# Patient Record
Sex: Female | Born: 1985 | Race: White | Hispanic: No | Smoking: Current every day smoker
Health system: Southern US, Community
[De-identification: ages and names within clinical notes are randomized; demographics above are authoritative.]

## PROBLEM LIST (undated history)

## (undated) DIAGNOSIS — O24419 Gestational diabetes mellitus in pregnancy, unspecified control: Secondary | ICD-10-CM

## (undated) HISTORY — DX: Gestational diabetes mellitus in pregnancy, unspecified control: O24.419

---

## 2005-09-01 ENCOUNTER — Other Ambulatory Visit: Admission: RE | Admit: 2005-09-01 | Discharge: 2005-09-01 | Payer: Self-pay | Admitting: Obstetrics and Gynecology

## 2005-09-19 ENCOUNTER — Inpatient Hospital Stay (HOSPITAL_COMMUNITY): Admission: AD | Admit: 2005-09-19 | Discharge: 2005-09-20 | Payer: Self-pay | Admitting: Obstetrics and Gynecology

## 2005-12-12 ENCOUNTER — Inpatient Hospital Stay (HOSPITAL_COMMUNITY): Admission: AD | Admit: 2005-12-12 | Discharge: 2005-12-12 | Payer: Self-pay | Admitting: Obstetrics and Gynecology

## 2006-01-10 ENCOUNTER — Inpatient Hospital Stay (HOSPITAL_COMMUNITY): Admission: AD | Admit: 2006-01-10 | Discharge: 2006-01-10 | Payer: Self-pay | Admitting: Obstetrics and Gynecology

## 2006-01-30 ENCOUNTER — Inpatient Hospital Stay (HOSPITAL_COMMUNITY): Admission: AD | Admit: 2006-01-30 | Discharge: 2006-01-30 | Payer: Self-pay | Admitting: Obstetrics and Gynecology

## 2006-02-01 ENCOUNTER — Ambulatory Visit (HOSPITAL_COMMUNITY): Admission: RE | Admit: 2006-02-01 | Discharge: 2006-02-01 | Payer: Self-pay | Admitting: Obstetrics and Gynecology

## 2006-02-16 ENCOUNTER — Inpatient Hospital Stay (HOSPITAL_COMMUNITY): Admission: AD | Admit: 2006-02-16 | Discharge: 2006-02-16 | Payer: Self-pay | Admitting: Obstetrics and Gynecology

## 2006-02-27 ENCOUNTER — Inpatient Hospital Stay (HOSPITAL_COMMUNITY): Admission: AD | Admit: 2006-02-27 | Discharge: 2006-03-02 | Payer: Self-pay | Admitting: Obstetrics and Gynecology

## 2006-04-10 ENCOUNTER — Other Ambulatory Visit: Admission: RE | Admit: 2006-04-10 | Discharge: 2006-04-10 | Payer: Self-pay | Admitting: Obstetrics and Gynecology

## 2010-11-07 ENCOUNTER — Encounter: Payer: Self-pay | Admitting: Family Medicine

## 2014-01-07 ENCOUNTER — Ambulatory Visit: Payer: Self-pay | Admitting: Physician Assistant

## 2014-01-08 ENCOUNTER — Ambulatory Visit: Payer: 59 | Admitting: Physician Assistant

## 2014-01-08 ENCOUNTER — Encounter: Payer: Self-pay | Admitting: Physician Assistant

## 2014-01-08 ENCOUNTER — Ambulatory Visit (INDEPENDENT_AMBULATORY_CARE_PROVIDER_SITE_OTHER): Payer: 59 | Admitting: Physician Assistant

## 2014-01-08 ENCOUNTER — Ambulatory Visit (INDEPENDENT_AMBULATORY_CARE_PROVIDER_SITE_OTHER): Payer: 59

## 2014-01-08 VITALS — BP 112/68 | HR 76 | Ht 64.5 in | Wt 183.0 lb

## 2014-01-08 DIAGNOSIS — F319 Bipolar disorder, unspecified: Secondary | ICD-10-CM

## 2014-01-08 DIAGNOSIS — M722 Plantar fascial fibromatosis: Secondary | ICD-10-CM

## 2014-01-08 DIAGNOSIS — Z131 Encounter for screening for diabetes mellitus: Secondary | ICD-10-CM

## 2014-01-08 DIAGNOSIS — M7730 Calcaneal spur, unspecified foot: Secondary | ICD-10-CM

## 2014-01-08 DIAGNOSIS — M79609 Pain in unspecified limb: Secondary | ICD-10-CM

## 2014-01-08 DIAGNOSIS — R635 Abnormal weight gain: Secondary | ICD-10-CM

## 2014-01-08 DIAGNOSIS — M79671 Pain in right foot: Secondary | ICD-10-CM

## 2014-01-08 DIAGNOSIS — M7731 Calcaneal spur, right foot: Secondary | ICD-10-CM | POA: Insufficient documentation

## 2014-01-08 DIAGNOSIS — Z13220 Encounter for screening for lipoid disorders: Secondary | ICD-10-CM

## 2014-01-08 DIAGNOSIS — M2550 Pain in unspecified joint: Secondary | ICD-10-CM

## 2014-01-08 DIAGNOSIS — M79672 Pain in left foot: Secondary | ICD-10-CM

## 2014-01-08 LAB — COMPLETE METABOLIC PANEL WITH GFR
ALT: 13 U/L (ref 0–35)
AST: 13 U/L (ref 0–37)
Albumin: 4.3 g/dL (ref 3.5–5.2)
Alkaline Phosphatase: 40 U/L (ref 39–117)
BILIRUBIN TOTAL: 0.4 mg/dL (ref 0.2–1.2)
BUN: 7 mg/dL (ref 6–23)
CO2: 27 mEq/L (ref 19–32)
CREATININE: 0.56 mg/dL (ref 0.50–1.10)
Calcium: 9.1 mg/dL (ref 8.4–10.5)
Chloride: 104 mEq/L (ref 96–112)
GLUCOSE: 90 mg/dL (ref 70–99)
Potassium: 4.2 mEq/L (ref 3.5–5.3)
SODIUM: 138 meq/L (ref 135–145)
Total Protein: 6.8 g/dL (ref 6.0–8.3)

## 2014-01-08 LAB — LIPID PANEL
Cholesterol: 120 mg/dL (ref 0–200)
HDL: 35 mg/dL — ABNORMAL LOW (ref >39)
LDL Cholesterol: 69 mg/dL (ref 0–99)
TRIGLYCERIDES: 80 mg/dL (ref <150)
Total CHOL/HDL Ratio: 3.4 Ratio
VLDL: 16 mg/dL (ref 0–40)

## 2014-01-08 LAB — VITAMIN B12: VITAMIN B 12: 394 pg/mL (ref 211–911)

## 2014-01-08 LAB — TSH: TSH: 1.477 u[IU]/mL (ref 0.350–4.500)

## 2014-01-08 LAB — T3, FREE: T3 FREE: 3.3 pg/mL (ref 2.3–4.2)

## 2014-01-08 LAB — RHEUMATOID FACTOR: Rhuematoid fact SerPl-aCnc: <10 IU/mL (ref <=14)

## 2014-01-08 LAB — T4, FREE: Free T4: 1.12 ng/dL (ref 0.80–1.80)

## 2014-01-08 MED ORDER — MELOXICAM 15 MG PO TABS: 15.0000 mg | ORAL_TABLET | Freq: Every day | ORAL | Status: DC

## 2014-01-08 NOTE — Progress Notes (Signed)
Subjective:    Patient ID: Tammy Burch, female    DOB: [birth year], 28 y.o.   MRN: [id number]  HPI  Patient is a 28 year old female who presents to the clinic to establish care.  . Active Ambulatory Problems    Diagnosis Date Noted  . Calcaneal spur of right foot 01/08/2014  . Bipolar disorder, unspecified 01/10/2014   Resolved Ambulatory Problems    Diagnosis Date Noted  . No Resolved Ambulatory Problems   Past Medical History  Diagnosis Date  . Gestational diabetes 2005, 2007   . Family History  Problem Relation Age of Onset  . Depression Mother   . Hypertension Mother   . Depression Father   . Cancer Maternal Grandmother   . Cancer Maternal Grandfather   . Cancer Paternal Grandmother   . Cancer Paternal Grandfather    . History   Social History  . Marital Status: Legally Separated    Spouse Name: N/A    Number of Children: N/A  . Years of Education: N/A   Occupational History  . Not on file.   Social History Main Topics  . Smoking status: Current Every Day Smoker  . Smokeless tobacco: Not on file  . Alcohol Use: Yes  . Drug Use: No  . Sexual Activity: Yes    Birth Control/ Protection: IUD   Other Topics Concern  . Not on file   Social History Narrative  . No narrative on file    Patient's main concern is her right foot pain. She's had pain in bilateral feet for the last year however the past 2 weeks her foot pain has been unbearable. Pain is present in both feet and worse after standing on them all weekend. Patient works as a Leisure centre managerbartender. During the week the pain seems to improve however gets worse over the weekend. This last weekend she felt like she cannot even work. She's tried ibuprofen and help some. She has even started to have some bilateral knee pain.  Patient also like to discuss ongoing depression, anxiety, mood swings. Patient was diagnosed with bipolar and has been on medication to control him the past. She is currently not on any  medication. Previously Zoloft, Celexa, Abilify have not proven any benefit. She denies any suicidal or homicidal thoughts. She definitely feels more down right now. She would like to sleep all the time and has little energy. She does have a history of more manic behavior such as gambling. She has not done that in a while. Patient reports there are some things that she likes to use to self treat her mood. She has a lot going on in her family right now with her mother and sister. She just wants to get away from everybody and get better.   Review of Systems  All other systems reviewed and are negative.       Objective:   Physical Exam  Constitutional: She is oriented to person, place, and time. She appears well-developed and well-nourished.  Overweight  HENT:  Head: Normocephalic and atraumatic.  Cardiovascular: Normal rate, regular rhythm and normal heart sounds.   Pulmonary/Chest: Effort normal and breath sounds normal.  Musculoskeletal:  Bilateral feet-no edema to note. Full ROM without pain. Strength 5/5. Pain over right lateral heel and mid-foot and up into heel. NO pain with palpation over left foot. She describes the pain located over the heel and mid-food when it occurs.   Neurological: She is alert and oriented to person, place, and time.  Skin:  Skin is dry.  Psychiatric: She has a normal mood and affect. Her behavior is normal.          Assessment & Plan:  Right foot pain/left foot pain-will get x-rays today to compare. It does sound like there is some plantar fasciitis as well as potential bone spur and right foot. Discussed treatment. Gave patient walks a can to start daily. Discussed icing with frozen water bottle as well as wearing supportive shoes. Will refer to Dr. Karie Schwalbe. partner for orthotics and further management.  X-rays did confirm spur on calcaneal.  Multiple arthralgias- suspicious of some type of autoimmune disorder. Will get an blood work to evaluate for autoimmune  issues.    Bipolar disorder/fatigue-GAD-7 17, PHQ-9 was19. MDQ-9/13. patient requests we do not refer to psychiatry right now due to her specialist copay. I discussed with her I would try to manage and try a few options but is not succeeding that she needed to go to psychiatry. We'll start with Latuda 20mg  daily. Try samples can send rx if needed.  4 fatigue panel will get B12, TSH, CBC to look for any other causes of fatigue. Follow up in 4-6 weeks.   Obesity/weight management-discuss with patient we did not have time to get into detail with this today. We need to work on helping her pain and getting her moving then we can discuss options of weight loss.

## 2014-01-08 NOTE — Patient Instructions (Addendum)
I would like for you to get personal orthothics.

## 2014-01-09 LAB — ANA: ANA: POSITIVE — AB

## 2014-01-09 LAB — ANTI-NUCLEAR AB-TITER (ANA TITER): ANA Titer 1: NEGATIVE

## 2014-01-09 LAB — VITAMIN D 25 HYDROXY (VIT D DEFICIENCY, FRACTURES): VIT D 25 HYDROXY: 26 ng/mL — AB (ref 30–89)

## 2014-01-09 LAB — CYCLIC CITRUL PEPTIDE ANTIBODY, IGG

## 2014-01-10 DIAGNOSIS — M722 Plantar fascial fibromatosis: Secondary | ICD-10-CM | POA: Insufficient documentation

## 2014-01-10 DIAGNOSIS — F319 Bipolar disorder, unspecified: Secondary | ICD-10-CM | POA: Insufficient documentation

## 2014-01-10 DIAGNOSIS — M2550 Pain in unspecified joint: Secondary | ICD-10-CM | POA: Insufficient documentation

## 2014-01-13 ENCOUNTER — Other Ambulatory Visit: Payer: Self-pay | Admitting: Physician Assistant

## 2014-01-13 ENCOUNTER — Ambulatory Visit (INDEPENDENT_AMBULATORY_CARE_PROVIDER_SITE_OTHER): Payer: 59 | Admitting: Sports Medicine

## 2014-01-13 ENCOUNTER — Encounter: Payer: Self-pay | Admitting: Sports Medicine

## 2014-01-13 VITALS — BP 119/69 | HR 71 | Ht 64.0 in | Wt 175.0 lb

## 2014-01-13 DIAGNOSIS — M722 Plantar fascial fibromatosis: Secondary | ICD-10-CM

## 2014-01-13 DIAGNOSIS — M2550 Pain in unspecified joint: Secondary | ICD-10-CM

## 2014-01-13 DIAGNOSIS — M25569 Pain in unspecified knee: Secondary | ICD-10-CM

## 2014-01-13 DIAGNOSIS — M222X1 Patellofemoral disorders, right knee: Secondary | ICD-10-CM | POA: Insufficient documentation

## 2014-01-13 DIAGNOSIS — R768 Other specified abnormal immunological findings in serum: Secondary | ICD-10-CM

## 2014-01-13 DIAGNOSIS — M25561 Pain in right knee: Secondary | ICD-10-CM

## 2014-01-13 MED ORDER — ALPRAZOLAM 2 MG PO TABS: 2.0000 mg | ORAL_TABLET | ORAL | Status: DC | PRN

## 2014-01-13 NOTE — Assessment & Plan Note (Signed)
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 Custom orthotics and Mobic should help this.  I did advise her that when it does swell she should come in so that we can aspirate fluid.

## 2014-01-13 NOTE — Progress Notes (Signed)
   Subjective:    I'm seeing this patient as a consultation for: Tandy GawJade Breeback, PA-C    CC: Bilateral foot pain  HPI: This is a very pleasant 28 year old female with right worse than left pain in both feet at the calcaneal origin of the plantar fascia, worse with the first few steps in the morning, then particularly bad after long days at work. She also endorses pain she localizes at the metatarsal heads bilaterally. She tends to wear regular but unsupported shoes, and spends 12 hours a day on her feet. Pain is moderate, persistent.  Past medical history, Surgical history, Family history not pertinant except as noted below, Social history, Allergies, and medications have been entered into the medical record, reviewed, and no changes needed.   Review of Systems: No headache, visual changes, nausea, vomiting, diarrhea, constipation, dizziness, abdominal pain, skin rash, fevers, chills, night sweats, weight loss, swollen lymph nodes, body aches, joint swelling, muscle aches, chest pain, shortness of breath, mood changes, visual or auditory hallucinations.   Objective:   General: Well Developed, well nourished, and in no acute distress.  Neuro/Psych: Alert and oriented x3, extra-ocular muscles intact, able to move all 4 extremities, sensation grossly intact. Skin: Warm and dry, no rashes noted.  Respiratory: Not using accessory muscles, speaking in full sentences, trachea midline.  Cardiovascular: Pulses palpable, no extremity edema. Abdomen: Does not appear distended. Bilateral feet: No visible erythema or swelling. Range of motion is full in all directions. Strength is 5/5 in all directions. No hallux valgus. No pes cavus or pes planus. No abnormal callus noted. No pain over the navicular prominence, or base of fifth metatarsal. Tender to palpation of the calcaneal insertion of plantar fascia. No pain at the Achilles insertion. No pain over the calcaneal bursa. No pain of the  retrocalcaneal bursa. No tenderness to palpation over the tarsals, metatarsals, or phalanges. No hallux rigidus or limitus. No tenderness palpation over interphalangeal joints. No pain with compression of the metatarsal heads. Neurovascularly intact distally. There is also tenderness to palpation at the metatarsal heads with only minimal transverse arch breakdown.  Patient was fitted for a : standard, cushioned, semi-rigid orthotic. The orthotic was heated and afterward the patient stood on the orthotic blank positioned on the orthotic stand. The patient was positioned in subtalar neutral position and 10 degrees of ankle dorsiflexion in a weight bearing stance. After completion of molding, a stable base was applied to the orthotic blank. The blank was ground to a stable position for weight bearing. Size: 9 Base: Blue EVA Additional Posting and Padding: None The patient ambulated these, and they were very comfortable.  Impression and Recommendations:   This case required medical decision making of moderate complexity.

## 2014-01-13 NOTE — Assessment & Plan Note (Addendum)
With bilateral metatarsalgia. Custom orthotics as above, Mobic, home rehabilitation given. Advised no barefoot walking at home, return to see me in 4 weeks however if pain continues for an additional week or 2 certainly we could consider an early injection. I will premedicate her with 2 mg of alprazolam 2 hours before procedure.

## 2014-01-23 ENCOUNTER — Telehealth: Payer: Self-pay | Admitting: Physician Assistant

## 2014-01-23 NOTE — Telephone Encounter (Signed)
Patient request a call back about a prescription,she did not state which one on the vm she left'd- Thanks

## 2014-01-24 ENCOUNTER — Other Ambulatory Visit: Payer: Self-pay | Admitting: Physician Assistant

## 2014-01-24 ENCOUNTER — Telehealth: Payer: Self-pay | Admitting: *Deleted

## 2014-01-24 MED ORDER — LURASIDONE HCL 20 MG PO TABS: 1.0000 | ORAL_TABLET | Freq: Every day | ORAL | Status: DC

## 2014-01-24 NOTE — Telephone Encounter (Signed)
Tried calling pt back-she has no vm set up.

## 2014-01-24 NOTE — Telephone Encounter (Signed)
Ok I sent latuda to pharmacy. Make sure to use card given at office visit. If you do not have then come by and pick up. Follow up before run out.

## 2014-01-24 NOTE — Telephone Encounter (Signed)
Pt is requesting an rx for the latuda that you gave her samples of during her last visit.

## 2014-01-28 NOTE — Telephone Encounter (Signed)
Pt called back & was notified of rx.

## 2014-01-29 ENCOUNTER — Telehealth: Payer: Self-pay | Admitting: *Deleted

## 2014-01-29 NOTE — Telephone Encounter (Signed)
Patient calls and states that even with the coupon card you gave her the med is still going to cost her 90.00.  Wants to know if there is something else you can send in, says she is out of the JordanLatuda as of today. Barry DienesKimberly Gordon, LPN

## 2014-01-29 NOTE — Telephone Encounter (Signed)
There is not really a comparative to JordanLatuda. If willing could give samples for a month and buy a month making a little more affordable. If you are having signifcant mood improvement I do think that is a great option. If not I can switch you to lamictal. What would you like to do?

## 2014-01-30 ENCOUNTER — Other Ambulatory Visit: Payer: Self-pay | Admitting: Physician Assistant

## 2014-01-30 MED ORDER — LAMOTRIGINE 25 MG PO TABS: ORAL_TABLET | ORAL | Status: DC

## 2014-01-30 NOTE — Telephone Encounter (Signed)
Pt notified & stated that she has not seen any significant improvement & didn't know if she needed to take it a little longer or needed a higher dose but the $90 is still too steep for her.  She stated that a family member has been taking zoloft for a while & it's worked really well for her.  I advised her that zoloft isn't really a treatment for bipolar, that it was more for anxiety/depression.  Pt is willing to try lamictal.

## 2014-01-30 NOTE — Telephone Encounter (Signed)
Ok will send over lamictal for pt to try. Will taper up. 25mg  qd for 2 weeks then increase to 50mg  for 2 weeks then follow up in office.

## 2014-01-30 NOTE — Telephone Encounter (Signed)
Pt notified & was transferred to scheduling to change her appt with you next week to one month from now.

## 2014-02-05 ENCOUNTER — Ambulatory Visit: Payer: 59 | Admitting: Physician Assistant

## 2014-02-10 ENCOUNTER — Encounter: Payer: Self-pay | Admitting: Sports Medicine

## 2014-02-10 ENCOUNTER — Ambulatory Visit (INDEPENDENT_AMBULATORY_CARE_PROVIDER_SITE_OTHER): Payer: 59 | Admitting: Sports Medicine

## 2014-02-10 VITALS — BP 131/79 | HR 84 | Ht 64.0 in | Wt 185.0 lb

## 2014-02-10 DIAGNOSIS — M25569 Pain in unspecified knee: Secondary | ICD-10-CM

## 2014-02-10 DIAGNOSIS — M722 Plantar fascial fibromatosis: Secondary | ICD-10-CM

## 2014-02-10 DIAGNOSIS — M25561 Pain in right knee: Secondary | ICD-10-CM

## 2014-02-10 NOTE — Progress Notes (Signed)
  Subjective:    CC: Followup  HPI: Right knee pain: Localized under the patella, moderate, persistent, worse going up and down stairs. She did have a positive ANA, reflex titer was negative, with a negative CCP and rheumatoid factor. This is likely completely orthopedic. Has not yet done any rehabilitation exercises. She tells me that x-rays in the past have shown osteoarthritis  Bilateral plantar fasciitis: Continues to improve on a day by day basis with custom orthotics in rehabilitation exercises. She is not yet ready to pursue interventional treatment she has not yet plateaued in terms her pain relief.  Past medical history, Surgical history, Family history not pertinant except as noted below, Social history, Allergies, and medications have been entered into the medical record, reviewed, and no changes needed.   Review of Systems: No fevers, chills, night sweats, weight loss, chest pain, or shortness of breath.   Objective:    General: Well Developed, well nourished, and in no acute distress.  Neuro: Alert and oriented x3, extra-ocular muscles intact, sensation grossly intact.  HEENT: Normocephalic, atraumatic, pupils equal round reactive to light, neck supple, no masses, no lymphadenopathy, thyroid nonpalpable.  Skin: Warm and dry, no rashes. Cardiac: Regular rate and rhythm, no murmurs rubs or gallops, no lower extremity edema.  Respiratory: Clear to auscultation bilaterally. Not using accessory muscles, speaking in full sentences. Right Knee: Normal to inspection with no erythema or effusion or obvious bony abnormalities. Palpation normal with no warmth, joint line tenderness, patellar tenderness, or condyle tenderness. ROM full in flexion and extension and lower leg rotation. Ligaments with solid consistent endpoints including ACL, PCL, LCL, MCL. Negative Mcmurray's, Apley's, and Thessalonian tests. Non painful patellar compression. Patellar glide without crepitus. Patellar and  quadriceps tendons unremarkable. Hamstring and quadriceps strength is normal.   Impression and Recommendations:

## 2014-02-10 NOTE — Assessment & Plan Note (Signed)
With bilateral metatarsalgia, continues to improve with orthotics. She can come back if she plateaus and then we could consider an injection.

## 2014-02-10 NOTE — Assessment & Plan Note (Addendum)
She did have a positive ANA, reflex titer was negative, with a negative CCP and negative rheumatoid factor.  I do not think she needs to follow up with rheumatologist. Pain sound predominantly patellofemoral. Rheumatology is suggesting MRI. I think that she should work on patellofemoral rehabilitation, continue naproxen prescribed by rheumatology, I'd like to see her back in about a month, we will inject her knee if no better. She also tells me that prior x-rays have showed some osteoarthritis

## 2014-02-24 ENCOUNTER — Other Ambulatory Visit: Payer: Self-pay | Admitting: Physician Assistant

## 2014-03-05 ENCOUNTER — Ambulatory Visit: Payer: 59 | Admitting: Physician Assistant

## 2014-03-05 DIAGNOSIS — Z0289 Encounter for other administrative examinations: Secondary | ICD-10-CM

## 2014-03-11 ENCOUNTER — Ambulatory Visit: Payer: 59 | Admitting: Sports Medicine

## 2014-03-17 ENCOUNTER — Other Ambulatory Visit: Payer: Self-pay | Admitting: Physician Assistant

## 2014-03-24 ENCOUNTER — Encounter: Payer: Self-pay | Admitting: Physician Assistant

## 2014-03-24 ENCOUNTER — Ambulatory Visit (INDEPENDENT_AMBULATORY_CARE_PROVIDER_SITE_OTHER): Payer: 59 | Admitting: Sports Medicine

## 2014-03-24 ENCOUNTER — Ambulatory Visit (INDEPENDENT_AMBULATORY_CARE_PROVIDER_SITE_OTHER): Payer: 59 | Admitting: Physician Assistant

## 2014-03-24 ENCOUNTER — Encounter: Payer: Self-pay | Admitting: Sports Medicine

## 2014-03-24 VITALS — BP 117/67 | HR 72 | Wt 185.0 lb

## 2014-03-24 DIAGNOSIS — M25569 Pain in unspecified knee: Secondary | ICD-10-CM

## 2014-03-24 DIAGNOSIS — M222X1 Patellofemoral disorders, right knee: Secondary | ICD-10-CM

## 2014-03-24 DIAGNOSIS — R635 Abnormal weight gain: Secondary | ICD-10-CM

## 2014-03-24 DIAGNOSIS — F319 Bipolar disorder, unspecified: Secondary | ICD-10-CM

## 2014-03-24 MED ORDER — LAMOTRIGINE 100 MG PO TABS: ORAL_TABLET | ORAL | Status: DC

## 2014-03-24 MED ORDER — NAPROXEN-ESOMEPRAZOLE 500-20 MG PO TBEC: 1.0000 | DELAYED_RELEASE_TABLET | Freq: Two times a day (BID) | ORAL | Status: AC

## 2014-03-24 MED ORDER — PHENTERMINE HCL 37.5 MG PO TABS: 37.5000 mg | ORAL_TABLET | Freq: Every day | ORAL | Status: DC

## 2014-03-24 NOTE — Progress Notes (Signed)
    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 Symptoms are moderate, improving.  Past medical history, Surgical history, Family history not pertinant except as noted below, Social history, Allergies, and medications have been entered into the medical record, reviewed, and no changes needed.   Review of Systems: No fevers, chills, night sweats, weight loss, chest pain, or shortness of breath.   Objective:    General: Well Developed, well nourished, and in no acute distress.  Neuro: Alert and oriented x3, extra-ocular muscles intact, sensation grossly intact.  HEENT: Normocephalic, atraumatic, pupils equal round reactive to light, neck supple, no masses, no lymphadenopathy, thyroid nonpalpable.  Skin: Warm and dry, no rashes. Cardiac: Regular rate and rhythm, no murmurs rubs or gallops, no lower extremity edema. Respiratory: Clear to auscultation bilaterally. Not using accessory muscles, speaking in full sentences. Right Knee: Normal to inspection with no erythema or effusion or obvious bony abnormalities. Palpation normal with no warmth, joint line tenderness, patellar tenderness, or condyle tenderness. ROM full in flexion and extension and lower leg rotation. Ligaments with solid consistent endpoints including ACL, PCL, LCL, MCL. Negative Mcmurray's, Apley's, and Thessalonian tests. Non painful patellar compression. Patellar glide without crepitus. Patellar and quadriceps tendons unremarkable. Hamstring and quadriceps strength is normal.    Impression and Recommendations:

## 2014-03-24 NOTE — Assessment & Plan Note (Signed)
She did have some gastritis on simple naproxen, switching to Vimovo. Return in a month, I'm also going add formal physical therapy. If she is no better after one month we will proceed with interventional treatment in the form of a knee injection.

## 2014-03-24 NOTE — Patient Instructions (Addendum)
Melatonin 10mg  at night for sleep.  lamictal increase to 100mg  daily if needed can take 2 tablets daily.

## 2014-03-25 NOTE — Progress Notes (Signed)
   Subjective:    Patient ID: Tammy Burch, female    DOB: 1985/12/16, 28 y.o.   MRN: [id number]  HPI Patient is a 28 year old female who presents to the clinic to follow up on bipolar medication. She was  started  on latuda but she was not able to afford. We then switched her to Lamictal she has transitioned up to 50 mg a day. She reports 50% treatment of mood symptoms. She continues to struggle with sleep at night. She will sleep proximally 4-5 hours at night and then need an during the day. She's not taking anything to help her sleep. She does not feel hopeless or helpless.  Patient would like to discuss something to help her lose weight. She is very motivated. She has made some diet changes but struggling to work and exercise. She has started walking a couple times a week.   Review of Systems  All other systems reviewed and are negative.      Objective:   Physical Exam  Constitutional: She is oriented to person, place, and time. She appears well-developed and well-nourished.  Obese.  HENT:  Head: Normocephalic and atraumatic.  Cardiovascular: Normal rate, regular rhythm and normal heart sounds.   Pulmonary/Chest: Effort normal and breath sounds normal.  Neurological: She is alert and oriented to person, place, and time.  Skin: Skin is dry.  Psychiatric: She has a normal mood and affect. Her behavior is normal.          Assessment & Plan:  Bipolar- Let's increase to 100mg  daily and after 2 weeks can increase to 2tablets daily. Follow up in 4-6 weeks. 50% improvement as a very good number. Our goal is to get to 80-85% improvement with symptoms. A long as we can control symptoms here do not need to send to site.  Abnormal weight gain- will start phentermine 37.5 mg 1 tab daily in the morning. Discussed with patient importance of 1200-calorie diet as well as regular exercise at least 30 minutes 4-5 times a week. Discussed side effects of phentermine. If having increased  insomnia, palpitations, more nervousness or anxiety please stop medication call office. We could consider decreasing the dose before discontinuation. Followup in 4 weeks for weight recheck and symptom recheck. Patient does have some ongoing right knee pain. Weight loss can certainly help this.  Spent 30 minutes with patient greater than 50% of visit spent counseling patient regarding weight loss tips and medications.

## 2014-04-30 ENCOUNTER — Encounter: Payer: 59 | Admitting: Physician Assistant

## 2014-04-30 ENCOUNTER — Ambulatory Visit: Payer: 59 | Admitting: Sports Medicine

## 2014-04-30 ENCOUNTER — Telehealth: Payer: Self-pay | Admitting: Physician Assistant

## 2014-04-30 DIAGNOSIS — Z0289 Encounter for other administrative examinations: Secondary | ICD-10-CM

## 2014-04-30 NOTE — Progress Notes (Signed)
This encounter was created in error - please disregard.

## 2014-04-30 NOTE — Telephone Encounter (Signed)
Call pt: missed appt today. will need to follow up on phentermine before refills. Can given one month of lamictal to give her time to get in office if needed.

## 2014-05-12 ENCOUNTER — Telehealth: Payer: Self-pay

## 2014-05-12 NOTE — Telephone Encounter (Signed)
Stop phentermine and weight loss drinks. Continue on benadryl 25-50mg  up to three times a day. If not throat swelling/lip swelling then ok to wait to come in tomorrow or Wednesday for appt. Once rash goes away only introduce one weight loss item at a time.

## 2014-05-12 NOTE — Telephone Encounter (Signed)
Pt called and stated that last night she started to developed a rash on both her legs, arms, neck, bottom of her feet and her ankles. She stated that is red and itches. Pt denies her throat swelling or the feel of it closing, SOB, nausea, ha or diarrhea. Pt has not changed her lotions, soaps , laundry detergent or tried different foods. She did state that she started on the phentermine about 7 days ago and she also started drinking weight loss drinks at the same time. Pt stated that she has taken 25 mg Benadryl at 3 am and again at noon today. Please advise./Suzzanne Brunkhorst,CMA

## 2014-05-13 NOTE — Telephone Encounter (Signed)
Pt informed with understanding and [facility]./Treyana Sturgell,CMA

## 2014-05-14 ENCOUNTER — Ambulatory Visit: Payer: 59 | Admitting: Physician Assistant

## 2014-05-21 ENCOUNTER — Ambulatory Visit (INDEPENDENT_AMBULATORY_CARE_PROVIDER_SITE_OTHER): Payer: 59 | Admitting: Physician Assistant

## 2014-05-21 ENCOUNTER — Encounter: Payer: Self-pay | Admitting: Physician Assistant

## 2014-05-21 VITALS — BP 119/75 | HR 86 | Ht 64.0 in | Wt 180.0 lb

## 2014-05-21 DIAGNOSIS — Z79899 Other long term (current) drug therapy: Secondary | ICD-10-CM

## 2014-05-21 DIAGNOSIS — E669 Obesity, unspecified: Secondary | ICD-10-CM

## 2014-05-21 DIAGNOSIS — F319 Bipolar disorder, unspecified: Secondary | ICD-10-CM

## 2014-05-21 DIAGNOSIS — N926 Irregular menstruation, unspecified: Secondary | ICD-10-CM

## 2014-05-21 LAB — POCT URINE PREGNANCY: PREG TEST UR: NEGATIVE

## 2014-05-21 MED ORDER — LAMOTRIGINE 200 MG PO TABS: 200.0000 mg | ORAL_TABLET | Freq: Every day | ORAL | Status: DC

## 2014-05-21 NOTE — Progress Notes (Signed)
   Subjective:    Patient ID: Tammy Burch, female    DOB: [birth year], 28 y.o.   MRN: [id number]  HPI Patient is a 28 year old female who presents to the clinic to followup after possible allergic reaction to phentermine. She reports that at 7 days after starting phentermine she developed a rash and 3 days later had some shortness of breath and problems swallowing. She is unaware if this just the phentermine or the over-the-counter [employer] granted weight loss drinks that she was using in combination. She's also concerned because she is 5 days late for her period. She does have the Mirena IUD that has been having regular periods every month with IUD. She did go to emergency room for possible allergic reaction. Steroids were given as well as Benadryl. She was sent home with steroids but she did not take them. Patient is really motivated to lose weight.  Bipolar-patient is on well on increased Lamictal. This which has been her worse we get the she wonders if it's hormonal since she has not started her period.   Review of Systems  All other systems reviewed and are negative.      Objective:   Physical Exam  Constitutional: She is oriented to person, place, and time. She appears well-developed and well-nourished.  HENT:  Head: Normocephalic and atraumatic.  Cardiovascular: Normal rate, regular rhythm and normal heart sounds.   Pulmonary/Chest: Effort normal and breath sounds normal.  Neurological: She is alert and oriented to person, place, and time.  Skin:  NO RASH.  Psychiatric: She has a normal mood and affect. Her behavior is normal.          Assessment & Plan:  Obesity/abnormal weight gain- it is unclear if phentermine or the other supplement she was taking created allergic reactions. Discussed with the patient importance of knowing that second allergic reactions can be worse than the first exposure. Patient is very adamant about trying phentermine again. She does have a course  of prednisone at her home. If she tries again suggested starting at one half tab for at least 7 days then increasing to full tablet. If she has any of the similar symptoms to stop and take prednisone course along with Benadryl. Please let her office know so we can add to the medication list. Discussed other weight loss medications the patient is unable to afford at this time. Encouraged patient with the benefit of exercising regularly and diet changes to help with weight loss.  Missed menses- UPT negative.   Bipolar-refilled Lamictal for 3 months. We'll evaluate at that time if we need to make medication adjustments. In the meantime discuss with patient to call with any worsening symptoms or urgency needs. I am concerned the phentermine be interfering with some of her mental health. If she starts becoming back and has these problems she should discontinue them on our office.

## 2014-05-30 ENCOUNTER — Telehealth: Payer: Self-pay | Admitting: *Deleted

## 2014-05-30 NOTE — Telephone Encounter (Signed)
I spoke with patient at length regarding her problem. She voiced understanding and would try the miralax and if she does not improve will f/u for eval. Corliss SkainsJamie Ellard Nan, CMA

## 2014-05-30 NOTE — Telephone Encounter (Signed)
Tammy Burch calls to office with concerns that she may also have mono since her daughter was recently diagnosed. She said that she is feeling "sluggish" and has discomfort on her left side abd area. She denies fever or sorethroat, headache. I asked her when the last time she moved her bowels and if her stomach was distended or hard. She said that it had been several days since she has had a bowel movement. She didn't feel she needed an emergent appt but I told her I would certainly talk with you and call her back. Please advise. Corliss SkainsJamie Kenly Xiao, CMA

## 2014-05-30 NOTE — Telephone Encounter (Signed)
With exposure you could certainly have mono. Mono is a virus and treated symptomatically with ibuprofen, rest, and hydration.  If not having bowel movements could be causing some abdominal pain. Suggest increasing water and taking 1 capful of miralax twice daily for next 3 days.  If pain is worsening or has n/v/d or spike fever. Can come to be evaluated in UC.

## 2014-06-04 ENCOUNTER — Ambulatory Visit (INDEPENDENT_AMBULATORY_CARE_PROVIDER_SITE_OTHER): Payer: 59 | Admitting: Physician Assistant

## 2014-06-04 ENCOUNTER — Ambulatory Visit (INDEPENDENT_AMBULATORY_CARE_PROVIDER_SITE_OTHER): Payer: 59

## 2014-06-04 ENCOUNTER — Encounter: Payer: Self-pay | Admitting: Physician Assistant

## 2014-06-04 VITALS — BP 109/72 | HR 83 | Wt 175.0 lb

## 2014-06-04 DIAGNOSIS — R350 Frequency of micturition: Secondary | ICD-10-CM

## 2014-06-04 DIAGNOSIS — Z975 Presence of (intrauterine) contraceptive device: Secondary | ICD-10-CM

## 2014-06-04 DIAGNOSIS — R221 Localized swelling, mass and lump, neck: Secondary | ICD-10-CM

## 2014-06-04 DIAGNOSIS — E049 Nontoxic goiter, unspecified: Secondary | ICD-10-CM

## 2014-06-04 DIAGNOSIS — R102 Pelvic and perineal pain: Secondary | ICD-10-CM

## 2014-06-04 DIAGNOSIS — R220 Localized swelling, mass and lump, head: Secondary | ICD-10-CM

## 2014-06-04 DIAGNOSIS — N949 Unspecified condition associated with female genital organs and menstrual cycle: Secondary | ICD-10-CM

## 2014-06-04 DIAGNOSIS — R1031 Right lower quadrant pain: Secondary | ICD-10-CM

## 2014-06-04 LAB — POCT URINALYSIS DIPSTICK
BILIRUBIN UA: NEGATIVE
Blood, UA: NEGATIVE
Glucose, UA: NEGATIVE
KETONES UA: NEGATIVE
Leukocytes, UA: NEGATIVE
Nitrite, UA: NEGATIVE
PH UA: 8
Protein, UA: NEGATIVE
Spec Grav, UA: 1.02
Urobilinogen, UA: 0.2

## 2014-06-04 MED ORDER — SULFAMETHOXAZOLE-TRIMETHOPRIM 800-160 MG PO TABS: 1.0000 | ORAL_TABLET | Freq: Two times a day (BID) | ORAL | Status: DC

## 2014-06-04 NOTE — Progress Notes (Signed)
   Subjective:    Patient ID: Tammy Burch, female    DOB: [birth year], 28 y.o.   MRN: [id number]  HPI Pt present to the clinic with urinary urgency, pelivic pain, RLQ pain. Urinating around every 10-15 seconds. Pain has been pretty consistent for last 3 days. No fever, chills, n/v/d. No vaginal discharge. No flank or back pain. Rates pain 6/10 at times.    Review of Systems  All other systems reviewed and are negative.      Objective:   Physical Exam  Constitutional: She is oriented to person, place, and time. She appears well-developed and well-nourished.  HENT:  Head: Normocephalic and atraumatic.  Neck: Normal range of motion. Neck supple.  Neck fullness bilaterally.   Cardiovascular: Normal rate, regular rhythm and normal heart sounds.   Pulmonary/Chest: Effort normal and breath sounds normal.  No CVA tenderness.   Abdominal: Soft. Bowel sounds are normal.  No guarding or rebound.  RLQ tenderness to palpation.   Neurological: She is alert and oriented to person, place, and time.  Skin: Skin is dry.  Psychiatric: She has a normal mood and affect. Her behavior is normal.          Assessment & Plan:  RLQ pain/pelvic pain/urinary frequency- .. Results for orders placed in visit on 06/04/14  URINE CULTURE      Result Value Ref Range   Colony Count 2,000 COLONIES/ML     Organism ID, Bacteria Insignificant Growth    POCT URINALYSIS DIPSTICK      Result Value Ref Range   Color, UA yellow     Clarity, UA clear     Glucose, UA neg     Bilirubin, UA neg     Ketones, UA neg     Spec Grav, UA 1.020     Blood, UA neg     pH, UA 8.0     Protein, UA neg     Urobilinogen, UA 0.2     Nitrite, UA neg     Leukocytes, UA Negative     Started on bactrim for 10 days.   Pt was called with results.  Cultured with no growth. Encouraged to stop bactrim since no infection. Pelvic ultrasound did show some free fluid.  Discussed with pt likely had ruputred ovarian cyst that was  causing the pain and symptoms. Follow up if not improving.   Neck fullness- will get ultraound to evaluate.

## 2014-06-05 ENCOUNTER — Telehealth: Payer: Self-pay

## 2014-06-05 NOTE — Telephone Encounter (Signed)
Tammy Burch reports she is now having vaginal pain and a worsening of pain in her back and pelvic area. She did take Naproxen with some relief last night. She was seen yesterday by Lesly RubensteinJade and had an ultra sound that was normal for the most part. Denies nausea, vomiting, fever, chills or sweats. Urine culture is still pending. Please advise.

## 2014-06-05 NOTE — Telephone Encounter (Signed)
recommend UC visit this evening.  Needs to be evaluated for PID.

## 2014-06-05 NOTE — Telephone Encounter (Signed)
Patient called and left a message on nurse line asking for a return call.   Returned Call: Left message asking patient to call back.  

## 2014-06-05 NOTE — Telephone Encounter (Signed)
Patient advised of recommendations.  

## 2014-06-06 ENCOUNTER — Ambulatory Visit: Payer: 59 | Admitting: Physician Assistant

## 2014-06-06 LAB — URINE CULTURE: Colony Count: 2000

## 2014-06-30 ENCOUNTER — Other Ambulatory Visit: Payer: Self-pay | Admitting: Physician Assistant

## 2014-07-03 ENCOUNTER — Other Ambulatory Visit: Payer: Self-pay | Admitting: Physician Assistant

## 2014-07-04 ENCOUNTER — Ambulatory Visit: Payer: 59 | Admitting: Family Medicine

## 2014-07-11 ENCOUNTER — Ambulatory Visit (INDEPENDENT_AMBULATORY_CARE_PROVIDER_SITE_OTHER): Payer: 59 | Admitting: Physician Assistant

## 2014-07-11 ENCOUNTER — Encounter: Payer: Self-pay | Admitting: Physician Assistant

## 2014-07-11 VITALS — BP 117/76 | HR 80 | Ht 64.0 in | Wt 171.0 lb

## 2014-07-11 DIAGNOSIS — R635 Abnormal weight gain: Secondary | ICD-10-CM

## 2014-07-11 MED ORDER — PHENTERMINE HCL 37.5 MG PO TABS: 37.5000 mg | ORAL_TABLET | Freq: Every day | ORAL | Status: DC

## 2014-07-14 NOTE — Progress Notes (Signed)
   Subjective:    Patient ID: Tammy Burch, female    DOB: [birth year], 28 y.o.   MRN: [id number]  HPI Pt presents to the clinic for follow up on phentermine. She is doing well and lost 9lbs in one month. She has stopped drinking sodas. She drinks only water. Not exercising regularly. Denies any side effects to medication. She is only taking one/half dose for this month.    Review of Systems  All other systems reviewed and are negative.      Objective:   Physical Exam  Constitutional: She is oriented to person, place, and time. She appears well-developed and well-nourished.  HENT:  Head: Normocephalic and atraumatic.  Cardiovascular: Normal rate, regular rhythm and normal heart sounds.   Pulmonary/Chest: Effort normal and breath sounds normal.  Neurological: She is alert and oriented to person, place, and time.  Skin: Skin is dry.  Psychiatric: She has a normal mood and affect. Her behavior is normal.          Assessment & Plan:  Abnormal weight gain- refilled phentermine. Discussed can take full tablet as tolerated. Encouraged pt to make one more lifestyle adjustment this month. Perhaps exercising at least 4 times a week. Follow up in 1 month with vitals check.

## 2014-08-08 ENCOUNTER — Ambulatory Visit (INDEPENDENT_AMBULATORY_CARE_PROVIDER_SITE_OTHER): Payer: 59 | Admitting: Physician Assistant

## 2014-08-08 VITALS — BP 119/72 | HR 76 | Wt 164.0 lb

## 2014-08-08 DIAGNOSIS — R635 Abnormal weight gain: Secondary | ICD-10-CM

## 2014-08-08 MED ORDER — PHENTERMINE HCL 37.5 MG PO TABS: 37.5000 mg | ORAL_TABLET | Freq: Every day | ORAL | Status: DC

## 2014-08-08 NOTE — Progress Notes (Signed)
   Subjective:    Patient ID: Tammy Burch, female    DOB: [birth year], 28 y.o.   MRN: [id number]  HPI Tammy Burch reports today for  Phentermine refill. She has lost 5lbs this last month. Next appt she was explained to make with provider. Corliss SkainsJamie Stpehen Burch, Tammy Burch    Review of Systems     Objective:   Physical Exam        Assessment & Plan:  Pt doing well and no side effects. Continues to lose weight. Follow up  In 1 month. Tandy GawJade Breeback PA-C

## 2014-08-23 ENCOUNTER — Other Ambulatory Visit: Payer: Self-pay | Admitting: Physician Assistant

## 2014-09-08 ENCOUNTER — Encounter: Payer: Self-pay | Admitting: Physician Assistant

## 2014-09-08 ENCOUNTER — Ambulatory Visit (INDEPENDENT_AMBULATORY_CARE_PROVIDER_SITE_OTHER): Payer: 59 | Admitting: Physician Assistant

## 2014-09-08 VITALS — BP 128/75 | HR 86 | Ht 64.0 in | Wt 160.0 lb

## 2014-09-08 DIAGNOSIS — R635 Abnormal weight gain: Secondary | ICD-10-CM

## 2014-09-08 MED ORDER — PHENTERMINE HCL 37.5 MG PO TABS: 37.5000 mg | ORAL_TABLET | Freq: Every day | ORAL | Status: AC

## 2014-09-08 NOTE — Progress Notes (Signed)
   Subjective:    Patient ID: Tammy Burch, female    DOB: 1985/11/30, 28 y.o.   MRN: [id number]  HPI Pt presents to the clinic to follow up on abnormal weight gain. She is doing well on phentermine. Been on for 3 months and lost from 185 to 160. Lost 4lbs this month. No side effects. Feels great. Continues to make diet changes and walking. She does have a gym membership starting in January.    Review of Systems  All other systems reviewed and are negative.      Objective:   Physical Exam  Constitutional: She is oriented to person, place, and time. She appears well-developed and well-nourished.  HENT:  Head: Normocephalic and atraumatic.  Cardiovascular: Normal rate, regular rhythm and normal heart sounds.   Pulmonary/Chest: Effort normal and breath sounds normal.  Neurological: She is alert and oriented to person, place, and time.  Psychiatric: She has a normal mood and affect. Her behavior is normal.          Assessment & Plan:  Abnormal weight gain- refilled for 3 more months phentermine. Discussed tapering off of medication. Continue to make diet changes and exercise. Follow up as needed.

## 2014-09-14 IMAGING — CR DG FOOT COMPLETE 3+V*R*
3 series · 3 of 3 positions shown · non-contrast
Comparison: None.

CLINICAL DATA: Bilateral foot pain, no known injury

EXAM:
RIGHT FOOT COMPLETE - 3+ VIEW

[view not recorded (1 of 3)]
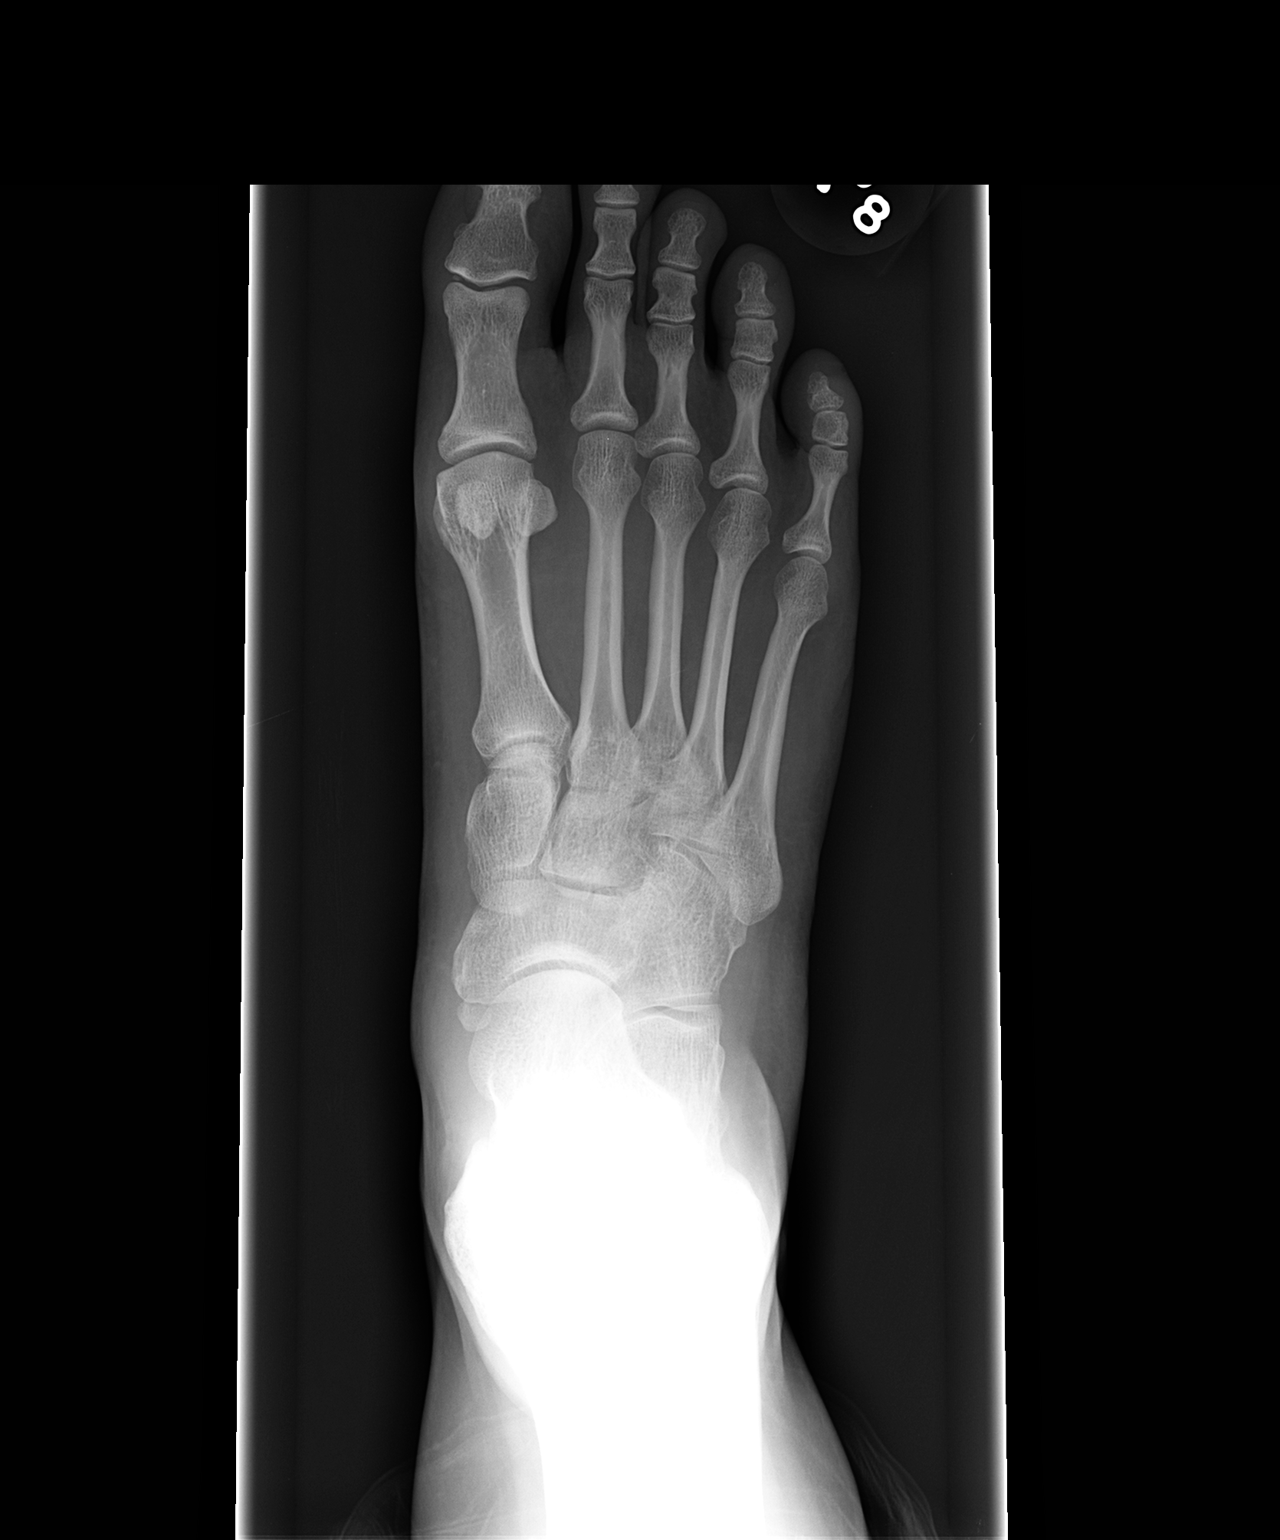

[view not recorded (2 of 3)]
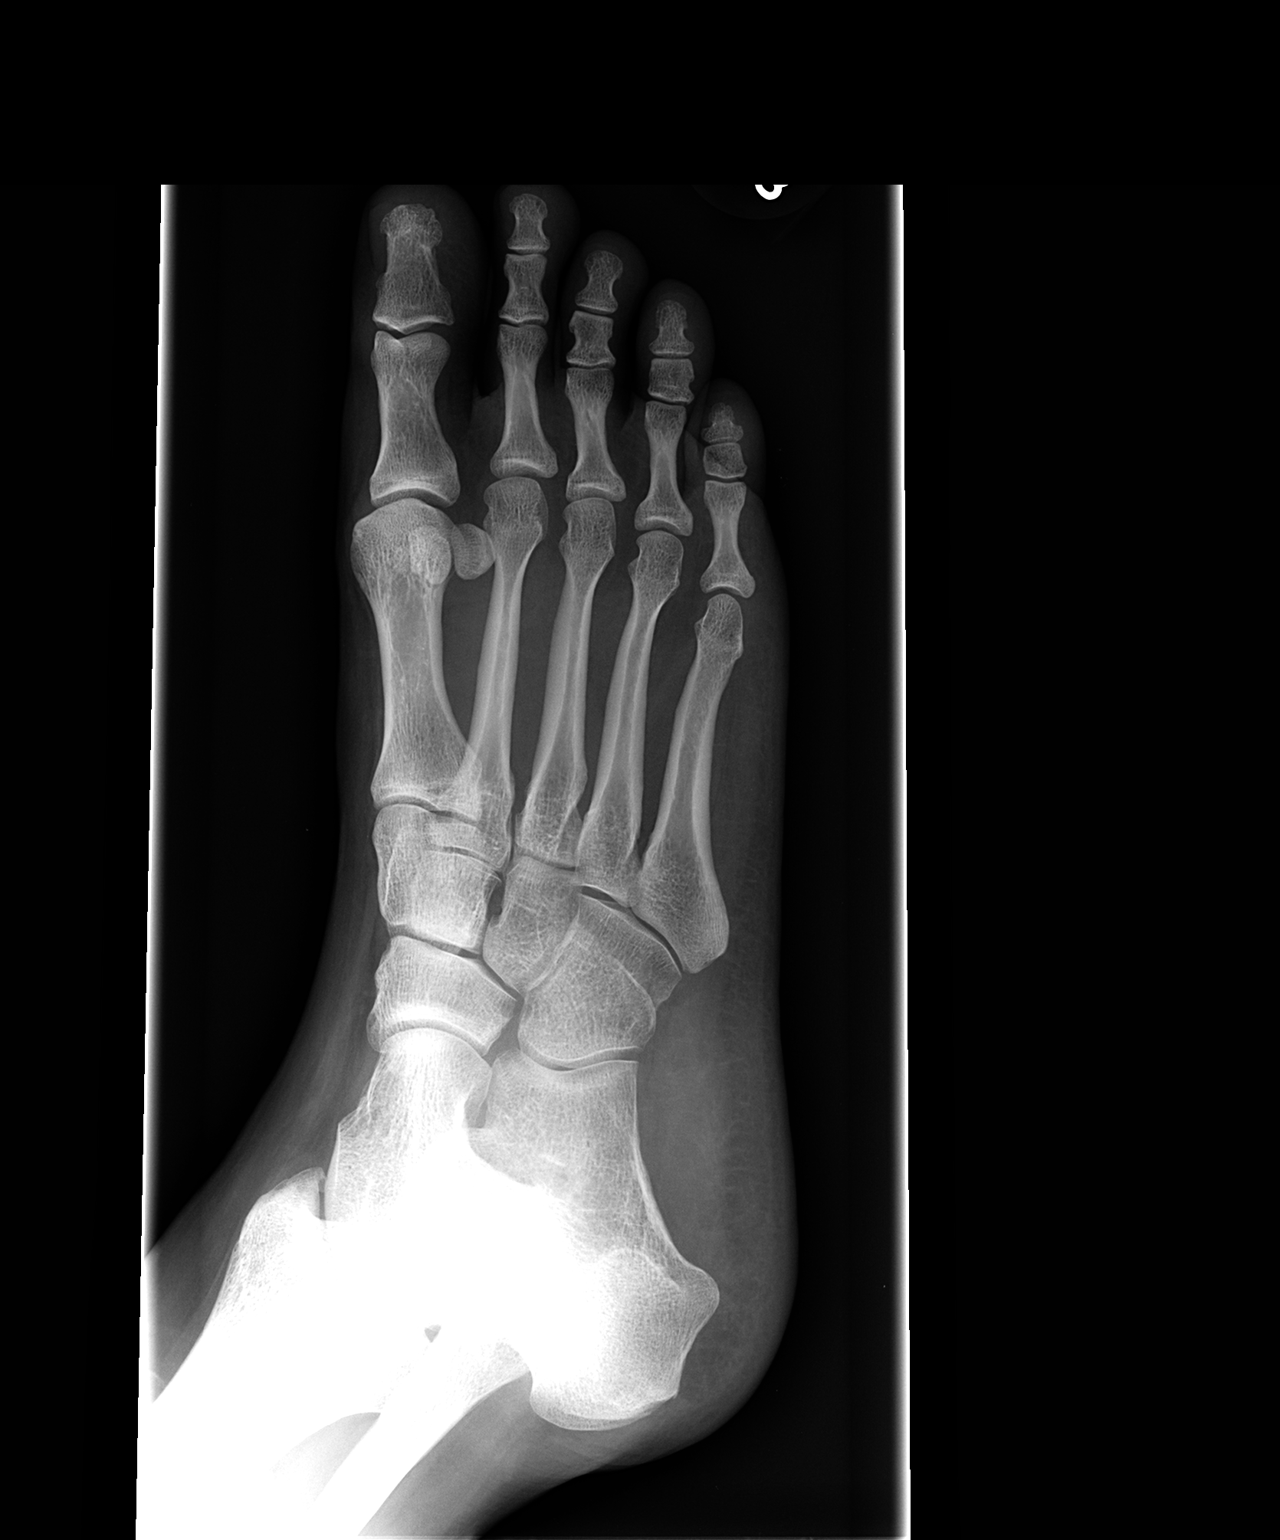

[view not recorded (3 of 3)]
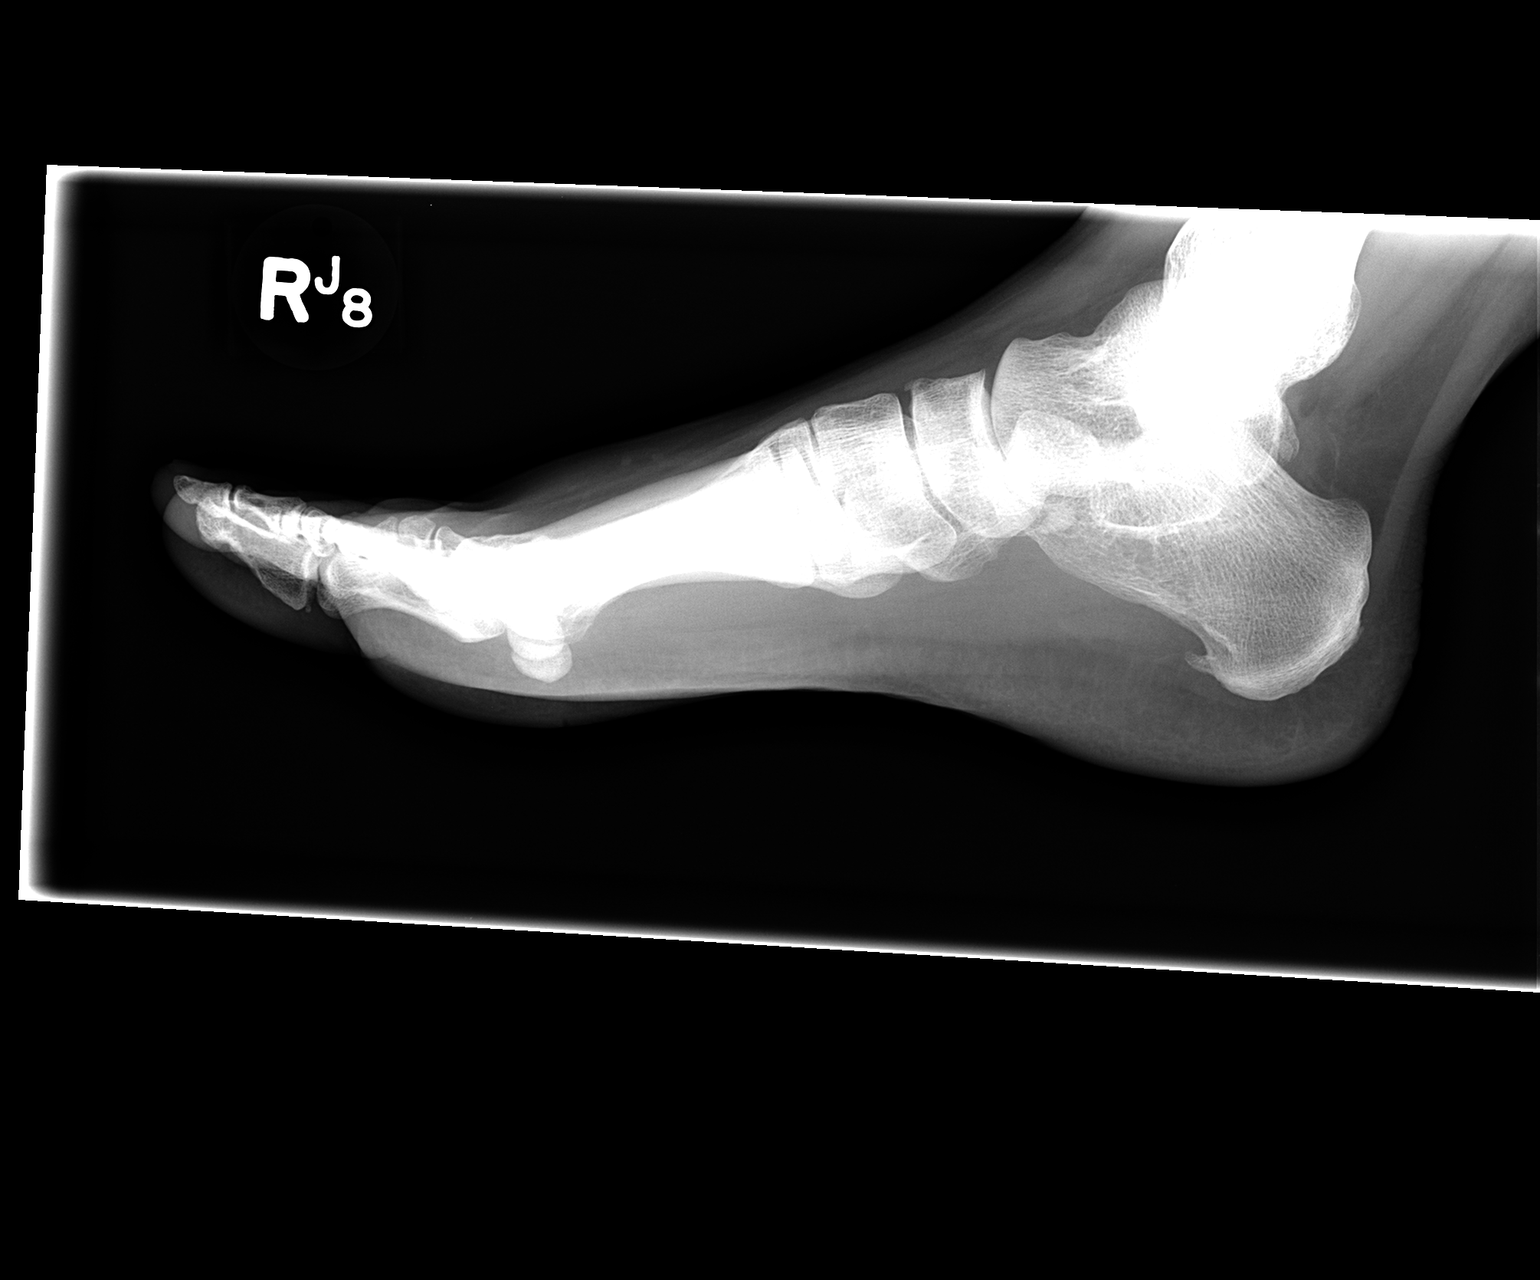

[3 of 3 positions shown; findings below may reference images not displayed]

FINDINGS: Three views of the right foot submitted. No acute fracture or
subluxation. Small plantar spur of calcaneus.
IMPRESSION: No acute fracture or subluxation.  Small plantar spur of calcaneus.

## 2015-02-08 IMAGING — US US TRANSVAGINAL NON-OB
1 series · 14 of 25 positions shown · non-contrast
Comparison: None

CLINICAL DATA: Urinary frequency. Right lower quadrant tenderness.
History of ovarian cysts.

EXAM:
TRANSABDOMINAL AND TRANSVAGINAL ULTRASOUND OF PELVIS
TECHNIQUE: Both transabdominal and transvaginal ultrasound examinations of the
pelvis were performed. Transabdominal technique was performed for
global imaging of the pelvis including uterus, ovaries, adnexal
regions, and pelvic cul-de-sac. It was necessary to proceed with
endovaginal exam following the transabdominal exam to visualize the
uterus, endometrium, ovaries and adnexa .

[Series 1: us transvaginal non-ob · 0.32mm/px · 14 of 70 slices shown]
[im 1/70]
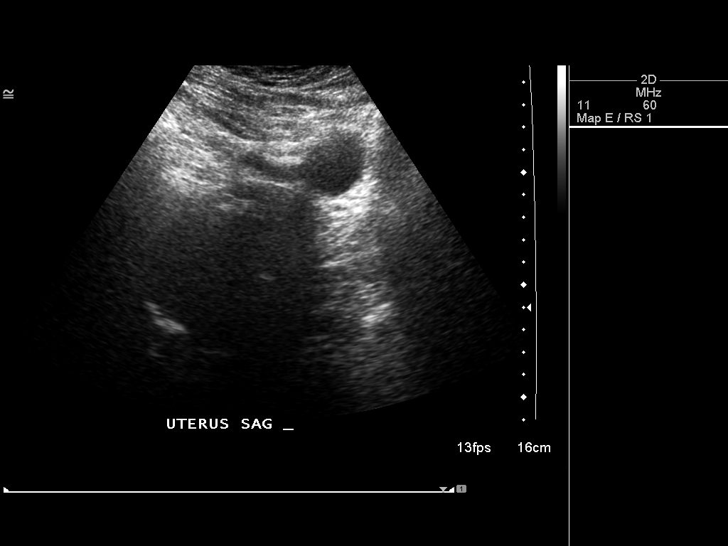
[im 6/70]
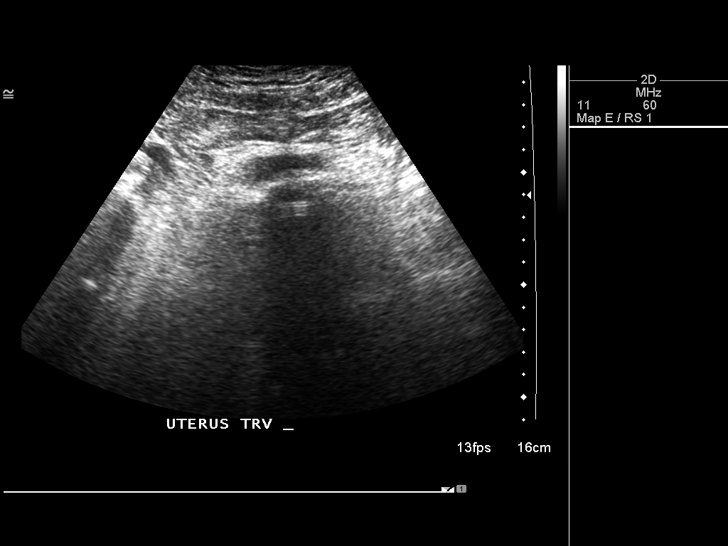
[im 12/70]
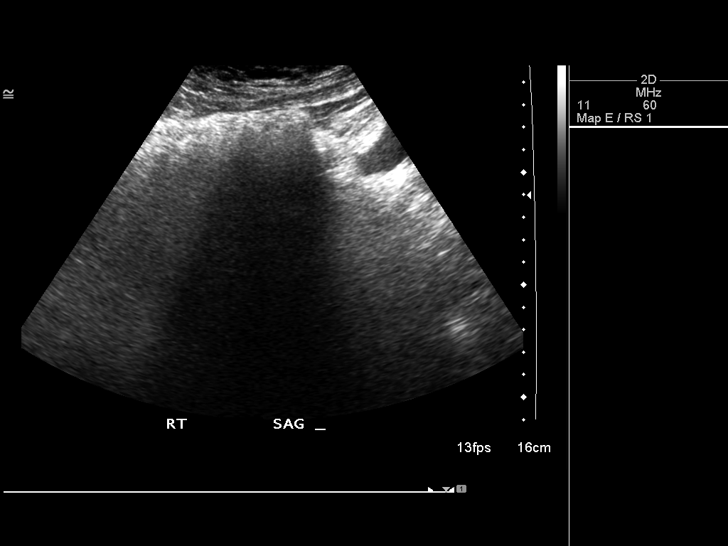
[im 18/70]
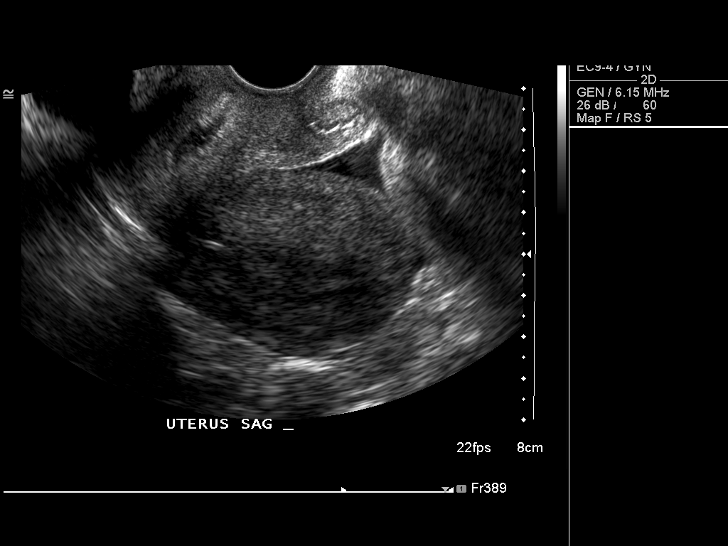
[im 24/70]
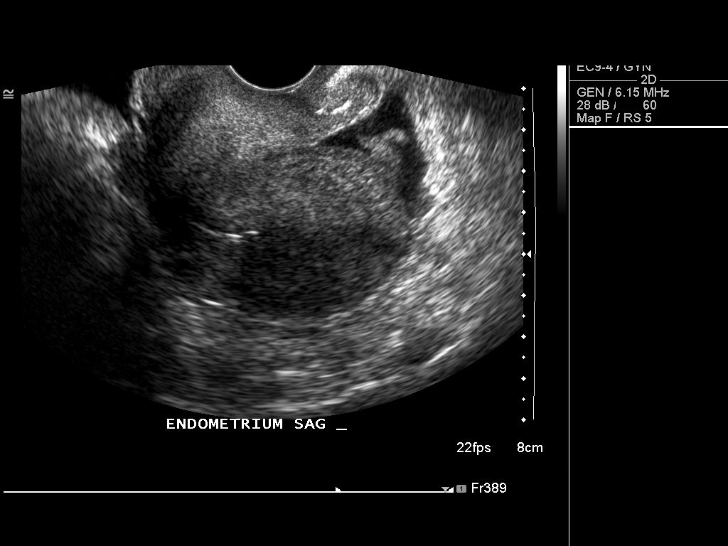
[im 26/70]
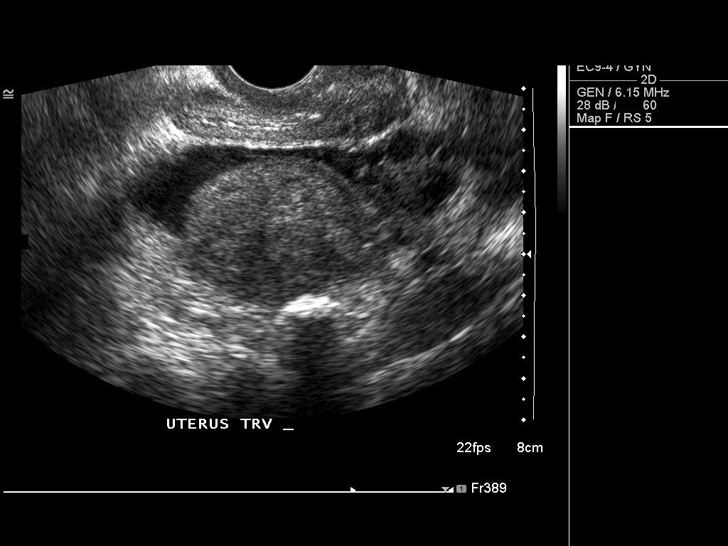
[im 32/70]
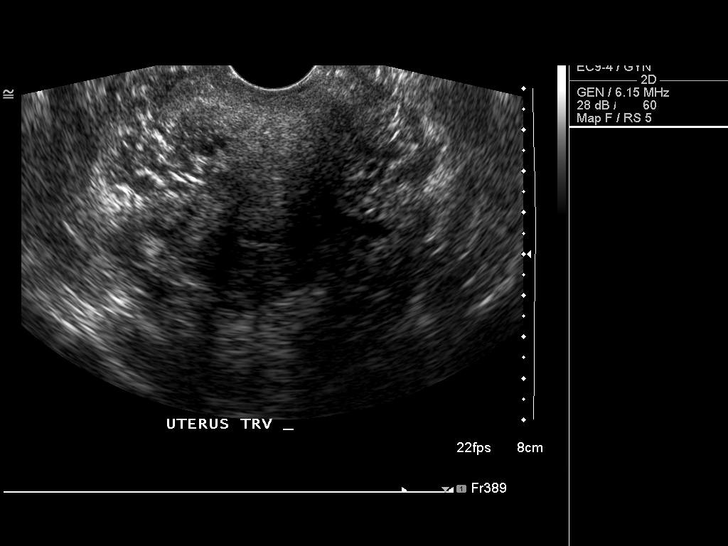
[im 38/70]
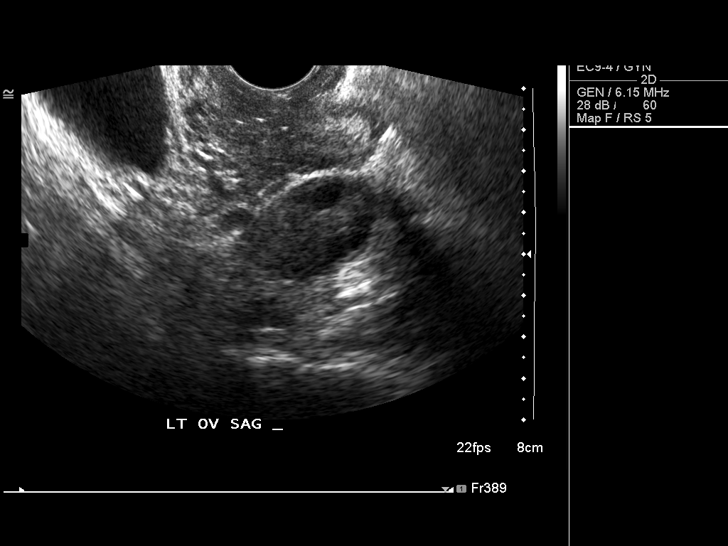
[im 44/70]
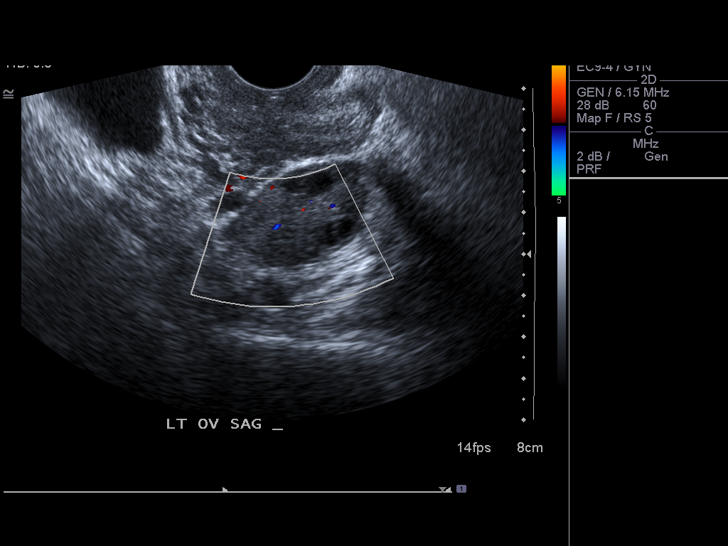
[im 47/70]
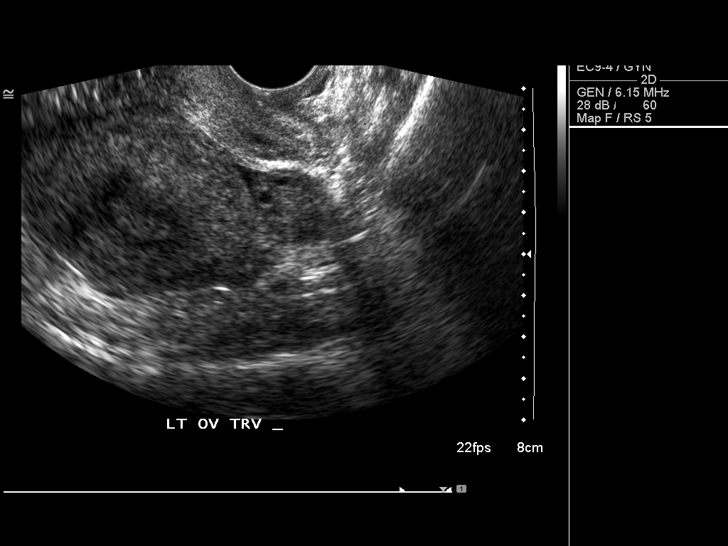
[im 52/70]
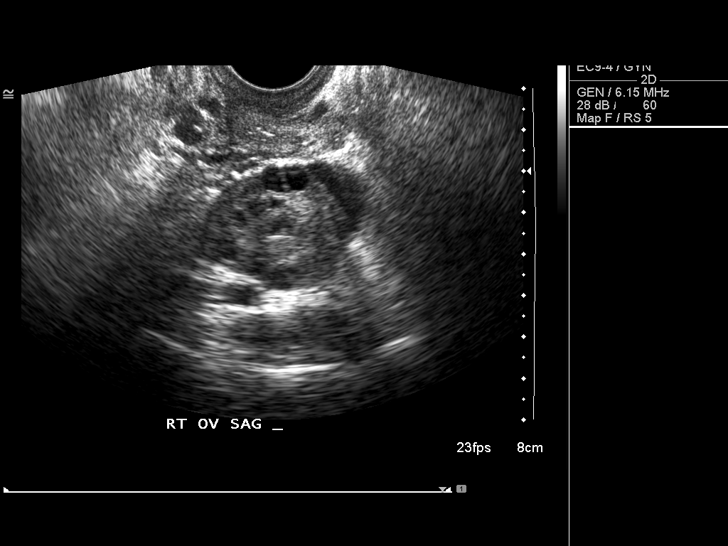
[im 58/70]
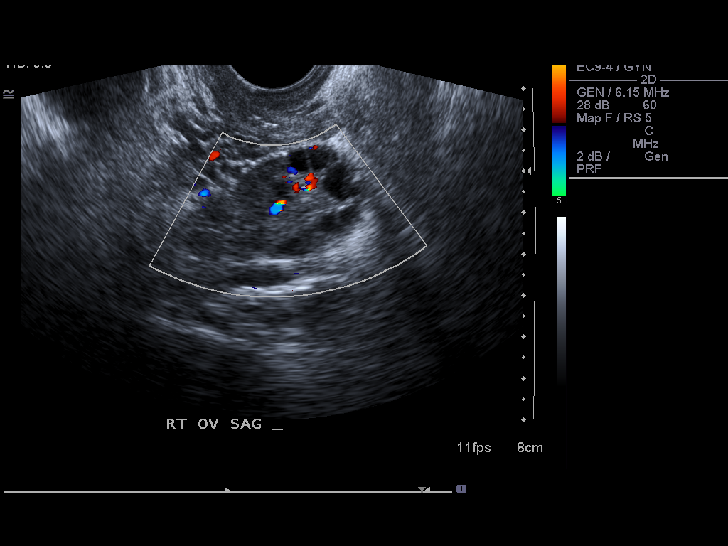
[im 64/70]
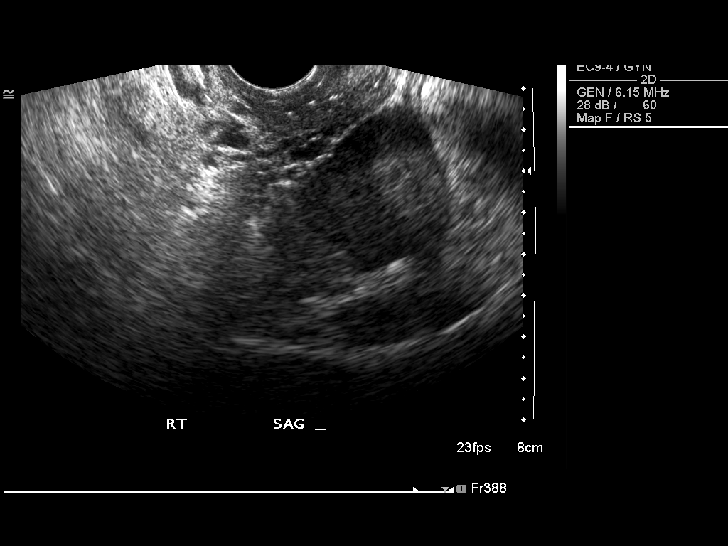
[im 70/70]
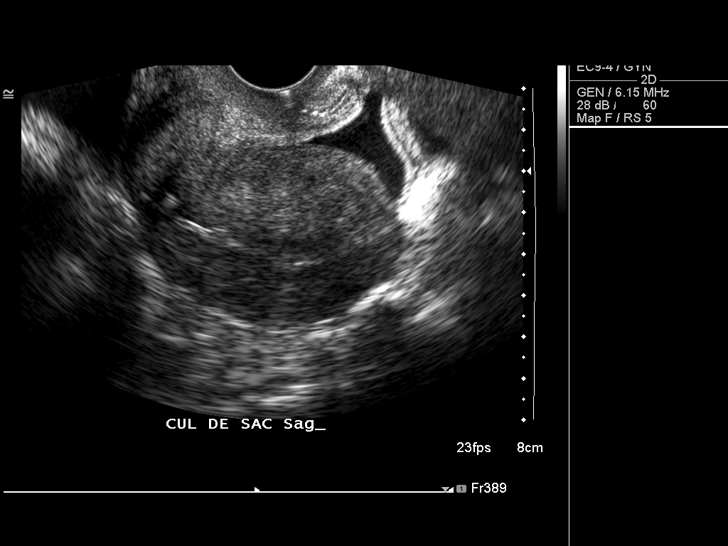

[14 of 25 positions shown; findings below may reference images not displayed]

FINDINGS: Uterus

Measurements: 7.4 x 4.6 x 5.8 cm. No fibroids or other mass
visualized. Uterus is retroverted

Endometrium

Thickness: 6 mm in thickness. No focal abnormality visualized. IUD
in place within the lower body and lower uterine segment region.

Right ovary

Measurements: 3.7 x 2.7 x 1.9 cm. Normal appearance/no adnexal mass.

Left ovary

Measurements: 3.4 x 2.3 x 2.2 cm. Normal appearance/no adnexal mass.

Other findings

Small to moderate free fluid in the pelvis.
IMPRESSION: IUD in place within the mid to lower uterus.

Small to moderate free fluid in the pelvis.
